# Patient Record
Sex: Male | Born: 1995 | Race: White | Hispanic: No | Marital: Single | Smoking: Never smoker
Health system: Southern US, Community
[De-identification: ages and names within clinical notes are randomized; demographics above are authoritative.]

## PROBLEM LIST (undated history)

## (undated) HISTORY — PX: APPENDECTOMY: SHX54

---

## 2018-08-13 ENCOUNTER — Encounter (HOSPITAL_BASED_OUTPATIENT_CLINIC_OR_DEPARTMENT_OTHER): Payer: Self-pay | Admitting: *Deleted

## 2018-08-13 ENCOUNTER — Emergency Department (HOSPITAL_BASED_OUTPATIENT_CLINIC_OR_DEPARTMENT_OTHER)
Admission: EM | Admit: 2018-08-13 | Discharge: 2018-08-13 | Disposition: A | Discharge status: Home / Self Care | Payer: Managed Care, Other (non HMO) | Attending: Emergency Medicine | Admitting: Emergency Medicine

## 2018-08-13 ENCOUNTER — Other Ambulatory Visit: Payer: Self-pay

## 2018-08-13 ENCOUNTER — Emergency Department (HOSPITAL_BASED_OUTPATIENT_CLINIC_OR_DEPARTMENT_OTHER): Payer: Managed Care, Other (non HMO)

## 2018-08-13 DIAGNOSIS — S8991XA Unspecified injury of right lower leg, initial encounter: Secondary | ICD-10-CM | POA: Diagnosis present

## 2018-08-13 DIAGNOSIS — F17220 Nicotine dependence, chewing tobacco, uncomplicated: Secondary | ICD-10-CM | POA: Insufficient documentation

## 2018-08-13 DIAGNOSIS — Y999 Unspecified external cause status: Secondary | ICD-10-CM | POA: Insufficient documentation

## 2018-08-13 DIAGNOSIS — Y92320 Baseball field as the place of occurrence of the external cause: Secondary | ICD-10-CM | POA: Diagnosis not present

## 2018-08-13 DIAGNOSIS — Y9364 Activity, baseball: Secondary | ICD-10-CM | POA: Insufficient documentation

## 2018-08-13 DIAGNOSIS — W500XXA Accidental hit or strike by another person, initial encounter: Secondary | ICD-10-CM | POA: Diagnosis not present

## 2018-08-13 MED ORDER — IBUPROFEN 800 MG PO TABS: 800.0000 mg | ORAL_TABLET | Freq: Three times a day (TID) | ORAL | 0 refills | Status: DC | PRN

## 2018-08-13 NOTE — ED Triage Notes (Signed)
Right knee injury yesterday during a collision with another baseball player.

## 2018-08-13 NOTE — Discharge Instructions (Addendum)
You were seen in the emergency department for right knee pain after a collision during baseball.  Your x-rays did not show any fracture.  You likely have a ligament sprain or even a tear.  This will need follow-up with an orthopedic surgeon and possibly an MRI.  Please contact Dr. Debroah Loop office on Monday.  Use the immobilizer as needed for comfort as well as crutches.  Ice to the affected area and ibuprofen 3 times a day with food on your stomach.  Return if any concerns.

## 2018-08-13 NOTE — ED Provider Notes (Signed)
[facility] EMERGENCY DEPARTMENT Provider Note   CSN: [id number] Arrival date & time: 08/13/18  1958     History   Chief Complaint Chief Complaint  Patient presents with  . Knee Injury    HPI Bradley Wood is a 23 y.o. male.  He has no significant past medical history.  He was playing baseball and was in a collision with another player sliding into second base.  This occurred yesterday.  Since then he has had severe right knee pain and swelling.  He has had some prior cartilage issues in the knee but never required surgery.  He does not have an orthopedic surgeon.  No other injury or complaints.     The history is provided by the patient.  Knee Pain Location:  Knee Time since incident:  24 hours Injury: yes   Mechanism of injury: fall   Fall:    Fall occurred:  Recreating/playing   Impact surface:  Dirt   Entrapped after fall: no   Knee location:  R knee Pain details:    Quality:  Throbbing   Radiates to:  Does not radiate   Severity:  Moderate   Onset quality:  Sudden   Timing:  Constant   Progression:  Unchanged Chronicity:  New Dislocation: no   Relieved by:  Nothing Worsened by:  Bearing weight Ineffective treatments:  Elevation and ice Associated symptoms: decreased ROM, stiffness and swelling   Associated symptoms: no back pain and no neck pain     History reviewed. No pertinent past medical history.  There are no active problems to display for this patient.   History reviewed. No pertinent surgical history.      Home Medications    Prior to Admission medications   Not on File    Family History No family history on file.  Social History Social History   Tobacco Use  . Smoking status: Never Smoker  . Smokeless tobacco: Current User    Types: Chew  Substance Use Topics  . Alcohol use: Never    Frequency: Never  . Drug use: Never     Allergies   Patient has no known allergies.   Review of Systems Review of Systems   Musculoskeletal: Positive for stiffness. Negative for back pain and neck pain.  Neurological: Negative for numbness.     Physical Exam Updated Vital Signs BP 125/79   Pulse 68   Temp 98.4 F (36.9 C) (Oral)   Resp 20   Ht 5\' 7"  (1.702 m)   Wt 70.3 kg   SpO2 100%   BMI 24.28 kg/m   Physical Exam Vitals signs and nursing note reviewed.  Constitutional:      Appearance: He is well-developed.  HENT:     Head: Normocephalic and atraumatic.  Eyes:     Conjunctiva/sclera: Conjunctivae normal.  Neck:     Musculoskeletal: Neck supple.  Pulmonary:     Effort: Pulmonary effort is normal.  Musculoskeletal:     Right lower leg: No edema.     Left lower leg: No edema.     Comments: Nontender right hip and right ankle.  Right knee he is got medial greater than lateral knee tenderness.  No patellar tenderness.  He is got some medial laxity and some anterior drawer travel.  Moderate effusion.  No erythema.  Extensor intact.  Skin:    General: Skin is warm and dry.     Capillary Refill: Capillary refill takes less than 2 seconds.  Neurological:  General: No focal deficit present.     Mental Status: He is alert.     GCS: GCS eye subscore is 4. GCS verbal subscore is 5. GCS motor subscore is 6.      ED Treatments / Results  Labs (all labs ordered are listed, but only abnormal results are displayed) Labs Reviewed - No data to display  EKG None  Radiology Dg Knee Complete 4 Views Right  Result Date: 08/13/2018 CLINICAL DATA:  Collision while playing baseball with medial pain, initial encounter EXAM: RIGHT KNEE - COMPLETE 4+ VIEW COMPARISON:  None. FINDINGS: No evidence of fracture, dislocation, or joint effusion. No evidence of arthropathy or other focal bone abnormality. Soft tissues are unremarkable. IMPRESSION: No acute abnormality noted. Electronically Signed   By: Alcide CleverMark  Lukens M.D.   On: 08/13/2018 20:54    Procedures Procedures (including critical care time)   Medications Ordered in ED Medications - No data to display   Initial Impression / Assessment and Plan / ED Course  I have reviewed the triage vital signs and the nursing notes.  Pertinent labs & imaging results that were available during my care of the patient were reviewed by me and considered in my medical decision making (see chart for details).  Clinical Course as of Aug 13 853  Fri Aug 13, 2018  [id number] 23 year old male with right knee injury playing baseball.  He is got some medial tenderness and some motion on stress of the joint.  Moderate joint effusion.  Likely has a MCL and probably an ACL injury.  X-rays negative for fracture.  MRI unavailable at this time so will have follow-up with Ortho and put in a knee immobilizer and crutches   [MB]    Clinical Course User Index [MB] Terrilee FilesButler, Byran Bilotti C, MD       Final Clinical Impressions(s) / ED Diagnoses   Final diagnoses:  Injury of ligament of right knee, initial encounter    ED Discharge Orders         Ordered    ibuprofen (ADVIL) 800 MG tablet  Every 8 hours PRN     08/13/18 2205           Terrilee FilesButler, Prestyn Mahn C, MD 08/14/18 [id number]

## 2020-05-15 IMAGING — DX RIGHT KNEE - COMPLETE 4+ VIEW
4 series · 4 of 4 positions shown · non-contrast
Comparison: None.

CLINICAL DATA: Collision while playing baseball with medial pain,
initial encounter

EXAM:
RIGHT KNEE - COMPLETE 4+ VIEW

[knee ap]
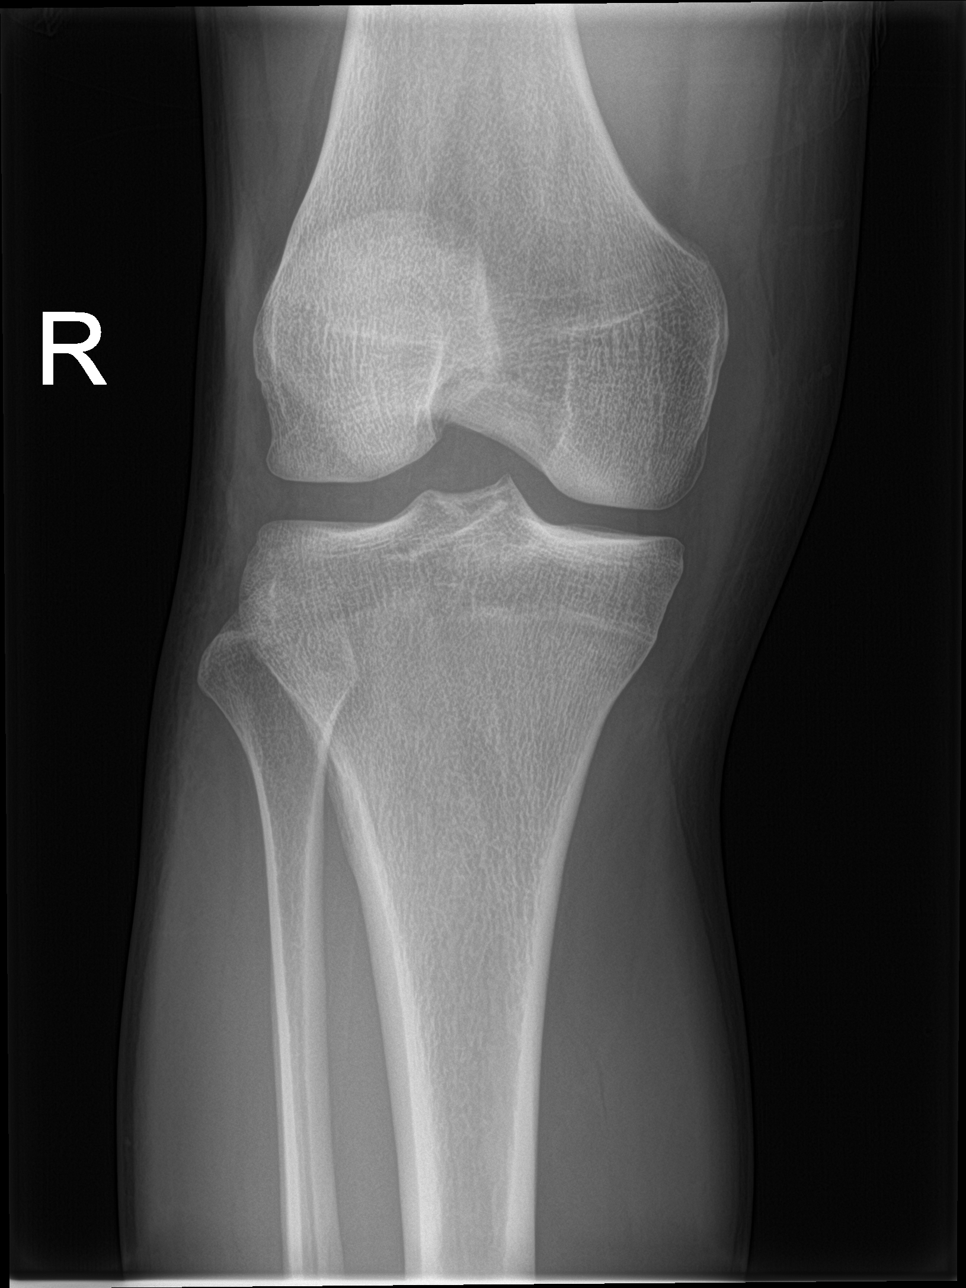

[knee lat]
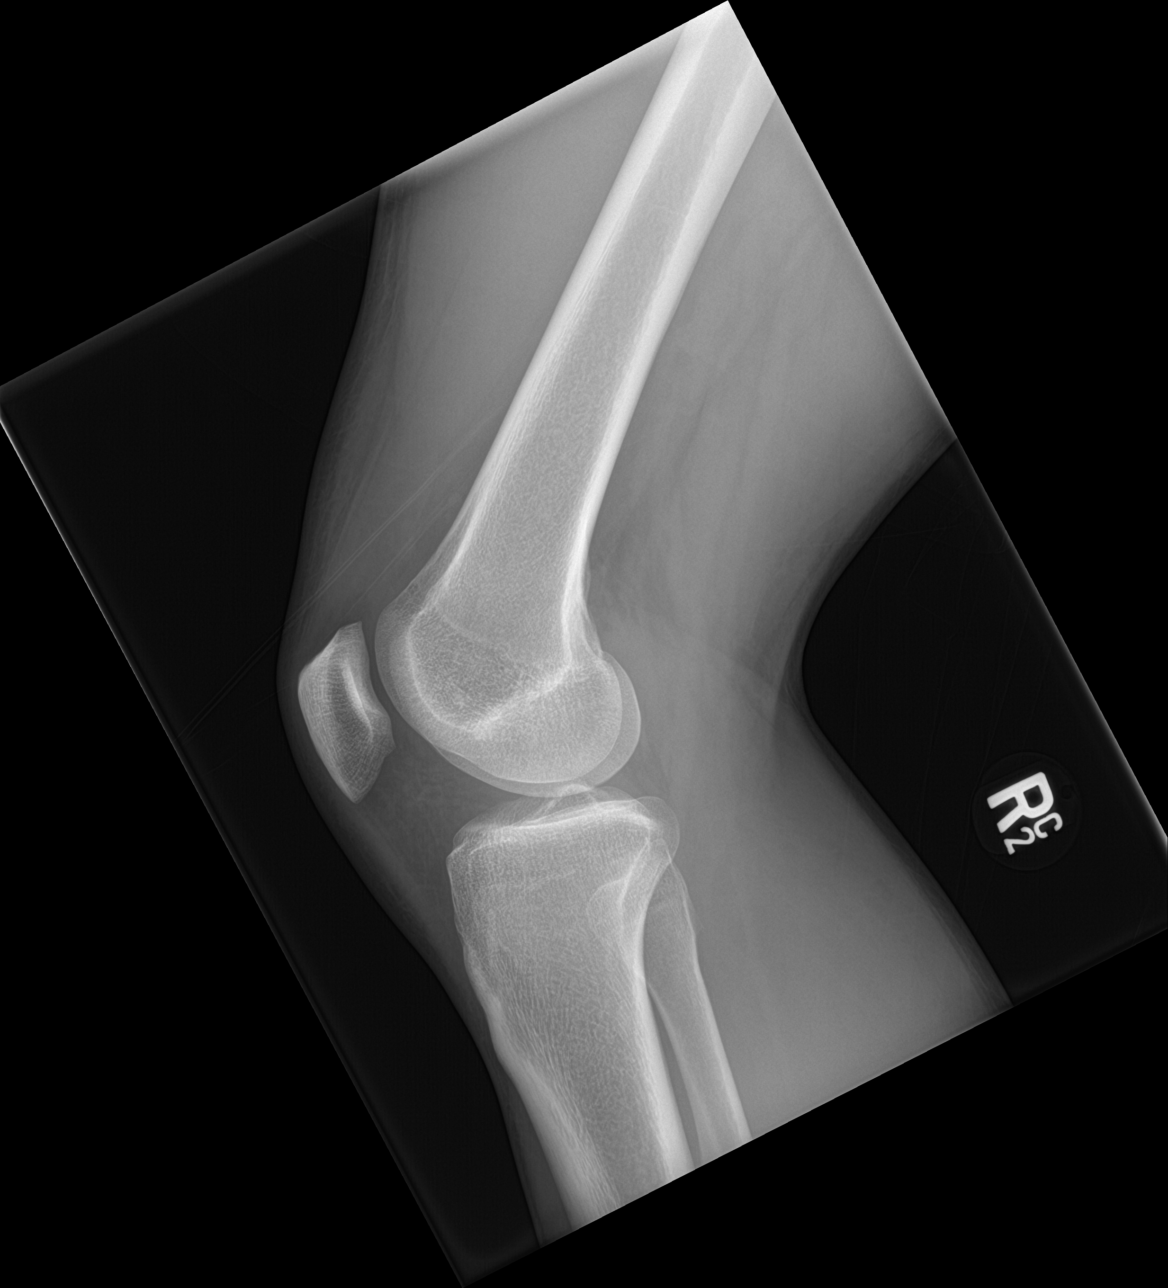

[knee obl (1 of 2)]
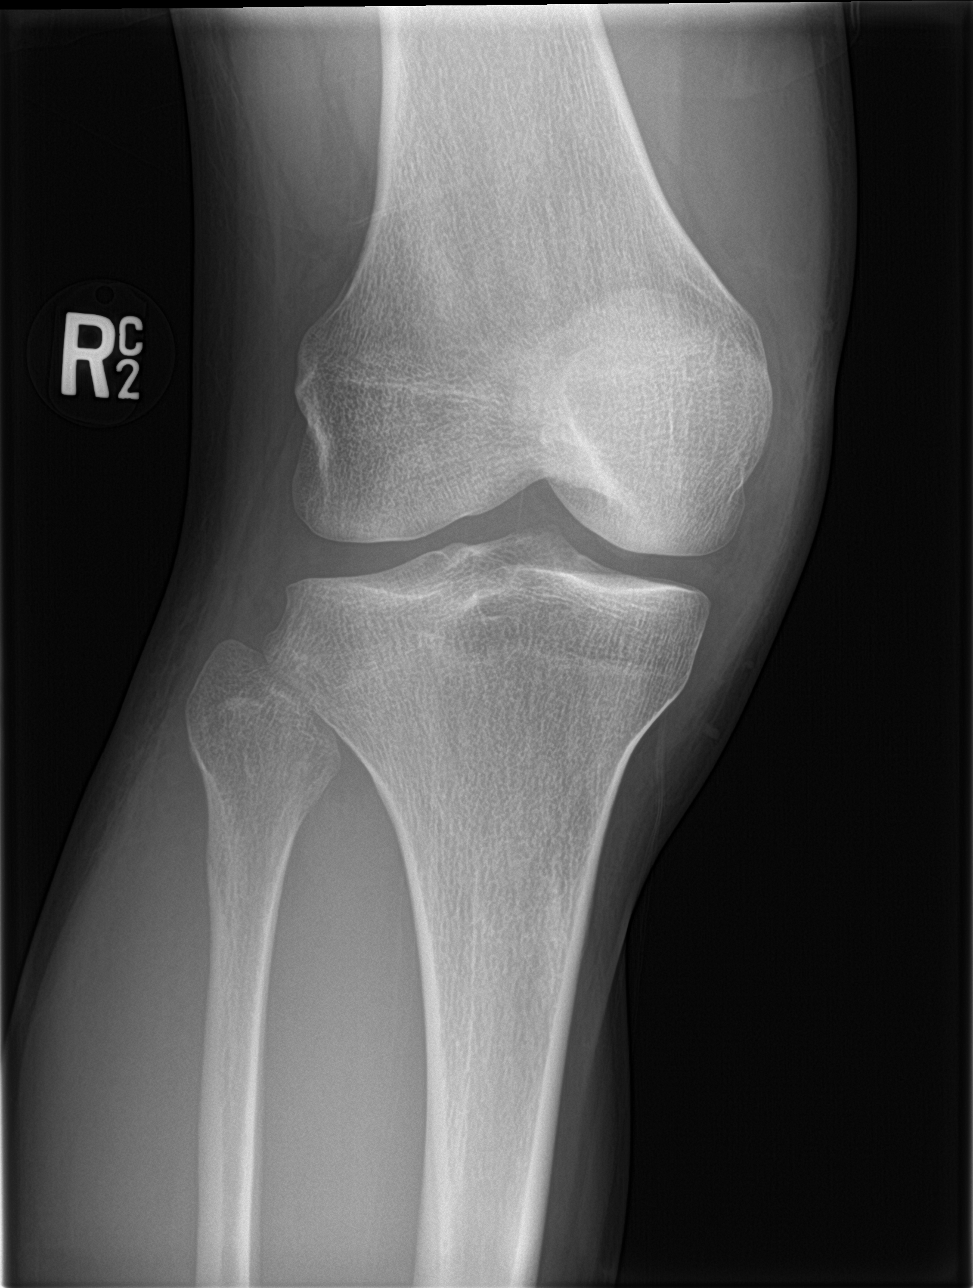

[knee obl (2 of 2)]
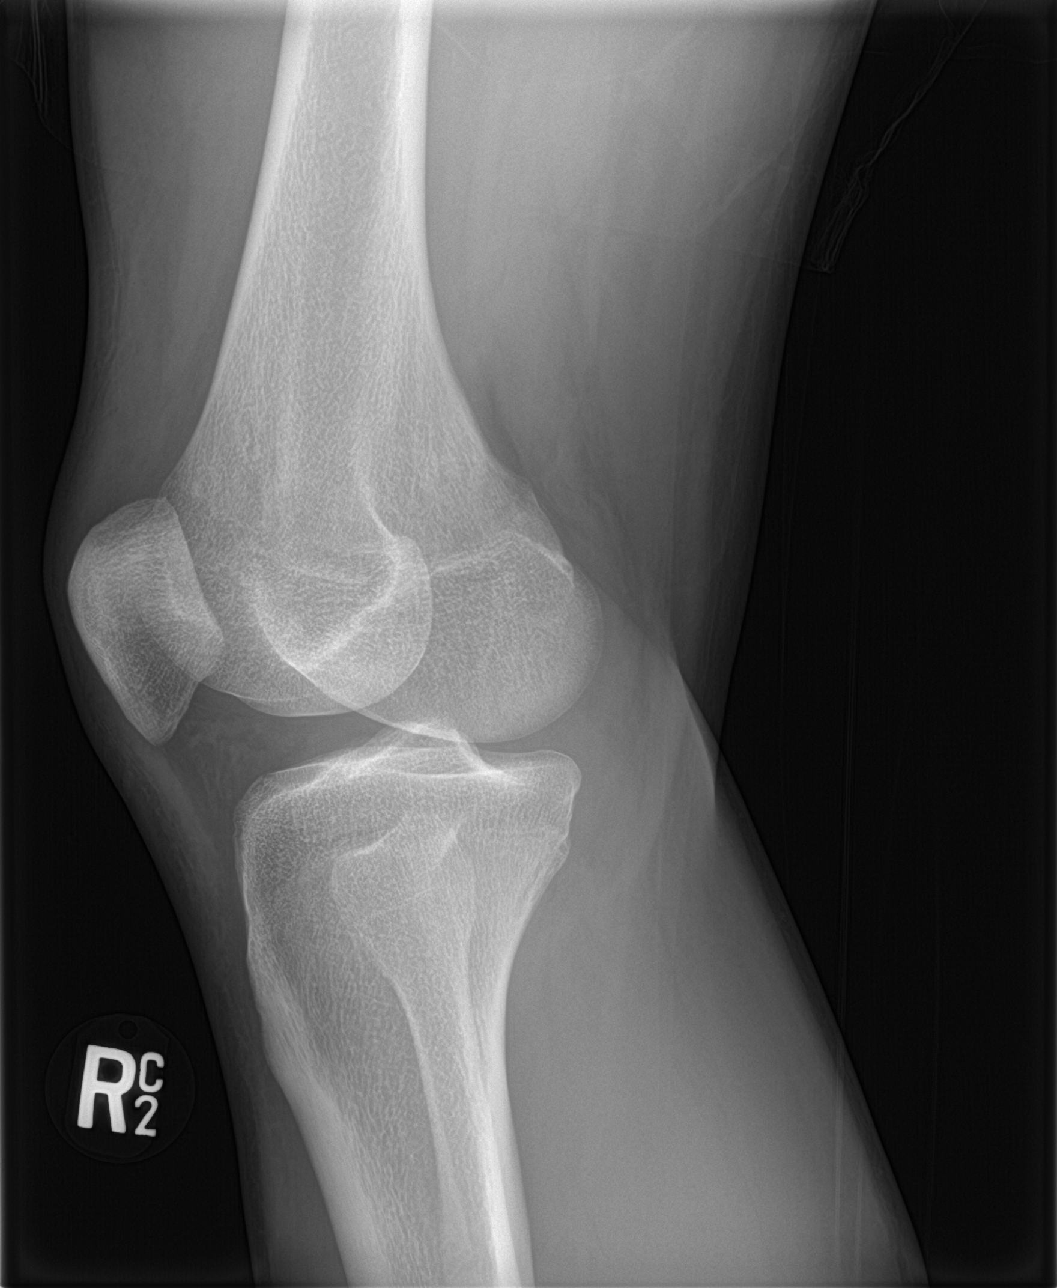

[4 of 4 positions shown; findings below may reference images not displayed]

FINDINGS: No evidence of fracture, dislocation, or joint effusion. No evidence
of arthropathy or other focal bone abnormality. Soft tissues are
unremarkable.
IMPRESSION: No acute abnormality noted.

## 2022-02-27 ENCOUNTER — Encounter (HOSPITAL_COMMUNITY): Payer: Self-pay | Admitting: Emergency Medicine

## 2022-02-27 ENCOUNTER — Ambulatory Visit (HOSPITAL_COMMUNITY)
Admission: EM | Admit: 2022-02-27 | Discharge: 2022-02-27 | Disposition: A | Discharge status: Home / Self Care | Payer: No Typology Code available for payment source | Attending: Family Medicine | Admitting: Family Medicine

## 2022-02-27 DIAGNOSIS — B349 Viral infection, unspecified: Secondary | ICD-10-CM | POA: Insufficient documentation

## 2022-02-27 DIAGNOSIS — R6883 Chills (without fever): Secondary | ICD-10-CM | POA: Insufficient documentation

## 2022-02-27 DIAGNOSIS — R112 Nausea with vomiting, unspecified: Secondary | ICD-10-CM | POA: Diagnosis present

## 2022-02-27 DIAGNOSIS — Z1152 Encounter for screening for COVID-19: Secondary | ICD-10-CM | POA: Insufficient documentation

## 2022-02-27 MED ORDER — ONDANSETRON 4 MG PO TBDP: 4.0000 mg | ORAL_TABLET | Freq: Three times a day (TID) | ORAL | 0 refills | Status: AC | PRN

## 2022-02-27 NOTE — ED Triage Notes (Signed)
Today woke up with body aches, cold chills, vomited, headache. Back is very sore Pt hast taken any medications.

## 2022-02-27 NOTE — ED Provider Notes (Addendum)
[facility]    CSN: [id number] Arrival date & time: 02/27/22  1455      History   Chief Complaint No chief complaint on file.   HPI Bradley Wood is a 27 y.o. male.   HPI Here for chills and nausea and vomiting.  He has had a little congestion.  Symptoms began yesterday.  He is thrown up today once.  No cough and no sore throat.  No fever at home and none here.  He has not take any medication for fever or pain.  No known exposures  He does dip snuff but he does not smoke tobacco  No dysuria History reviewed. No pertinent past medical history.  There are no problems to display for this patient.   Past Surgical History:  Procedure Laterality Date   APPENDECTOMY         Home Medications    Prior to Admission medications   Medication Sig Start Date End Date Taking? Authorizing Provider  ondansetron (ZOFRAN-ODT) 4 MG disintegrating tablet Take 1 tablet (4 mg total) by mouth every 8 (eight) hours as needed for nausea or vomiting. 02/27/22  Yes Brantley Wiley, Gwenlyn Perking, MD    Family History No family history on file.  Social History Social History   Tobacco Use   Smoking status: Never   Smokeless tobacco: Current    Types: Chew  Substance Use Topics   Alcohol use: Never   Drug use: Never     Allergies   Patient has no known allergies.   Review of Systems Review of Systems   Physical Exam Triage Vital Signs ED Triage Vitals  Enc Vitals Group     BP 02/27/22 1627 106/77     Pulse Rate 02/27/22 1627 98     Resp 02/27/22 1627 17     Temp 02/27/22 1627 98.7 F (37.1 C)     Temp src --      SpO2 02/27/22 1627 97 %     Weight --      Height --      Head Circumference --      Peak Flow --      Pain Score 02/27/22 1625 6     Pain Loc --      Pain Edu? --      Excl. in Hall? --    No data found.  Updated Vital Signs BP 106/77 (BP Location: Left Arm)   Pulse 98   Temp 98.7 F (37.1 C)   Resp 17   SpO2 97%   Visual Acuity Right Eye  Distance:   Left Eye Distance:   Bilateral Distance:    Right Eye Near:   Left Eye Near:    Bilateral Near:     Physical Exam Vitals reviewed.  Constitutional:      General: He is not in acute distress.    Appearance: He is not ill-appearing, toxic-appearing or diaphoretic.  HENT:     Right Ear: Tympanic membrane and ear canal normal.     Left Ear: Tympanic membrane and ear canal normal.     Nose: Congestion present.     Mouth/Throat:     Mouth: Mucous membranes are moist.     Pharynx: No oropharyngeal exudate or posterior oropharyngeal erythema.  Eyes:     Extraocular Movements: Extraocular movements intact.     Conjunctiva/sclera: Conjunctivae normal.     Pupils: Pupils are equal, round, and reactive to light.  Cardiovascular:     Rate and Rhythm: Normal  rate and regular rhythm.     Heart sounds: No murmur heard. Pulmonary:     Effort: No respiratory distress.     Breath sounds: No stridor. No wheezing, rhonchi or rales.  Abdominal:     General: There is no distension.     Palpations: Abdomen is soft.     Tenderness: There is no abdominal tenderness.  Musculoskeletal:     Cervical back: Neck supple.  Lymphadenopathy:     Cervical: No cervical adenopathy.  Skin:    Capillary Refill: Capillary refill takes less than 2 seconds.     Coloration: Skin is not jaundiced or pale.  Neurological:     General: No focal deficit present.     Mental Status: He is alert and oriented to person, place, and time.  Psychiatric:        Behavior: Behavior normal.      UC Treatments / Results  Labs (all labs ordered are listed, but only abnormal results are displayed) Labs Reviewed  SARS CORONAVIRUS 2 (TAT 6-24 HRS)    EKG   Radiology No results found.  Procedures Procedures (including critical care time)  Medications Ordered in UC Medications - No data to display  Initial Impression / Assessment and Plan / UC Course  I have reviewed the triage vital signs and the  nursing notes.  Pertinent labs & imaging results that were available during my care of the patient were reviewed by me and considered in my medical decision making (see chart for details).        The patient has checked his temperature at home and it was normal.  He was also normal here.  And that is without any Tylenol or ibuprofen on board.  I have discussed with the patient and his mother that with no fever I do not think flu testing is indicated.  He is swabbed for COVID, and if positive he will know if he needs to quarantine.  We discussed our plan and then mom wanted to know if we were going to swab him anyway why we did not do a flu test at the same time.  I again discussed that there is no need to have a flu test without fever and otherwise healthy young adult.  Zofran is sent for symptoms  Final diagnoses:  Viral illness     Discharge Instructions      Ondansetron dissolved in the mouth every 8 hours as needed for nausea or vomiting. Clear liquids and bland things to eat.   You have been swabbed for COVID, and the test will result in the next 24 hours. Our staff will call you if positive. If the COVID test is positive, you should quarantine for 5 days from the start of your symptoms.  On days 6-10 from the start of your illness, you should wear a mask if out in public.      ED Prescriptions     Medication Sig Dispense Auth. Provider   ondansetron (ZOFRAN-ODT) 4 MG disintegrating tablet Take 1 tablet (4 mg total) by mouth every 8 (eight) hours as needed for nausea or vomiting. 10 tablet Windy Carina Gwenlyn Perking, MD      PDMP not reviewed this encounter.   Barrett Henle, MD 02/27/22 1653    Barrett Henle, MD 02/27/22 [phone]

## 2022-02-27 NOTE — Discharge Instructions (Signed)
Ondansetron dissolved in the mouth every 8 hours as needed for nausea or vomiting. Clear liquids and bland things to eat.   You have been swabbed for COVID, and the test will result in the next 24 hours. Our staff will call you if positive. If the COVID test is positive, you should quarantine for 5 days from the start of your symptoms.  On days 6-10 from the start of your illness, you should wear a mask if out in public.

## 2022-02-28 LAB — SARS CORONAVIRUS 2 (TAT 6-24 HRS): SARS Coronavirus 2: NEGATIVE
